# Patient Record
Sex: Female | Born: 1990 | Race: Black or African American | Hispanic: No | Marital: Single | Smoking: Current every day smoker
Health system: Southern US, Community
[De-identification: ages and names within clinical notes are randomized; demographics above are authoritative.]

---

## 2018-10-22 ENCOUNTER — Encounter (HOSPITAL_COMMUNITY): Payer: Self-pay | Admitting: Emergency Medicine

## 2018-10-22 ENCOUNTER — Other Ambulatory Visit: Payer: Self-pay

## 2018-10-22 ENCOUNTER — Emergency Department (HOSPITAL_COMMUNITY): Payer: Self-pay

## 2018-10-22 ENCOUNTER — Emergency Department (HOSPITAL_COMMUNITY)
Admission: EM | Admit: 2018-10-22 | Discharge: 2018-10-22 | Disposition: A | Discharge status: Home / Self Care | Payer: Self-pay | Attending: Emergency Medicine | Admitting: Emergency Medicine

## 2018-10-22 DIAGNOSIS — F17210 Nicotine dependence, cigarettes, uncomplicated: Secondary | ICD-10-CM | POA: Insufficient documentation

## 2018-10-22 DIAGNOSIS — R102 Pelvic and perineal pain: Secondary | ICD-10-CM | POA: Insufficient documentation

## 2018-10-22 LAB — URINALYSIS, ROUTINE W REFLEX MICROSCOPIC
Bilirubin Urine: NEGATIVE
Glucose, UA: NEGATIVE mg/dL
Hgb urine dipstick: NEGATIVE
Ketones, ur: NEGATIVE mg/dL
Leukocytes,Ua: NEGATIVE
Nitrite: NEGATIVE
Protein, ur: NEGATIVE mg/dL
Specific Gravity, Urine: 1.017 (ref 1.005–1.030)
pH: 5 (ref 5.0–8.0)

## 2018-10-22 LAB — COMPREHENSIVE METABOLIC PANEL
ALT: 13 U/L (ref 0–44)
AST: 14 U/L — ABNORMAL LOW (ref 15–41)
Albumin: 4.1 g/dL (ref 3.5–5.0)
Alkaline Phosphatase: 66 U/L (ref 38–126)
Anion gap: 8 (ref 5–15)
BUN: 9 mg/dL (ref 6–20)
CO2: 21 mmol/L — ABNORMAL LOW (ref 22–32)
Calcium: 9 mg/dL (ref 8.9–10.3)
Chloride: 108 mmol/L (ref 98–111)
Creatinine, Ser: 0.68 mg/dL (ref 0.44–1.00)
GFR calc Af Amer: >60 mL/min (ref >60)
GFR calc non Af Amer: >60 mL/min (ref >60)
Glucose, Bld: 103 mg/dL — ABNORMAL HIGH (ref 70–99)
Potassium: 3.6 mmol/L (ref 3.5–5.1)
Sodium: 137 mmol/L (ref 135–145)
Total Bilirubin: 0.2 mg/dL — ABNORMAL LOW (ref 0.3–1.2)
Total Protein: 7.6 g/dL (ref 6.5–8.1)

## 2018-10-22 LAB — CBC
HCT: 37.2 % (ref 36.0–46.0)
Hemoglobin: 11.9 g/dL — ABNORMAL LOW (ref 12.0–15.0)
MCH: 28.3 pg (ref 26.0–34.0)
MCHC: 32 g/dL (ref 30.0–36.0)
MCV: 88.4 fL (ref 80.0–100.0)
Platelets: 236 10*3/uL (ref 150–400)
RBC: 4.21 MIL/uL (ref 3.87–5.11)
RDW: 14.4 % (ref 11.5–15.5)
WBC: 12.2 10*3/uL — ABNORMAL HIGH (ref 4.0–10.5)
nRBC: 0 % (ref 0.0–0.2)

## 2018-10-22 LAB — LIPASE, BLOOD: Lipase: 26 U/L (ref 11–51)

## 2018-10-22 LAB — HIV ANTIBODY (ROUTINE TESTING W REFLEX): HIV Screen 4th Generation wRfx: NONREACTIVE

## 2018-10-22 LAB — WET PREP, GENITAL
Sperm: NONE SEEN
Trich, Wet Prep: NONE SEEN
Yeast Wet Prep HPF POC: NONE SEEN

## 2018-10-22 LAB — RPR: RPR Ser Ql: NONREACTIVE

## 2018-10-22 LAB — I-STAT BETA HCG BLOOD, ED (MC, WL, AP ONLY): I-stat hCG, quantitative: <5 m[IU]/mL (ref <5)

## 2018-10-22 MED ORDER — SODIUM CHLORIDE 0.9% FLUSH: 3.0000 mL | Freq: Once | INTRAVENOUS | Status: DC

## 2018-10-22 MED ORDER — KETOROLAC TROMETHAMINE 30 MG/ML IJ SOLN
30.0000 mg | Freq: Once | INTRAMUSCULAR | Status: AC
  Administered 2018-10-22: 30 mg via INTRAVENOUS
  Filled 2018-10-22: qty 1

## 2018-10-22 NOTE — ED Notes (Signed)
Assisted with vaginal [place]. Pt tolerated well

## 2018-10-22 NOTE — ED Triage Notes (Signed)
Patient is complaining of abdominal pain after having intercourse with her boyfriend.

## 2018-10-22 NOTE — Discharge Instructions (Addendum)
Take naproxen or ibuprofen as needed for pain.   You may take acetaminophen as needed for additional pain relief.  Return if pain is getting worse.

## 2018-10-22 NOTE — ED Provider Notes (Signed)
[facility]-EMERGENCY DEPT Provider Note   CSN: [id number] Arrival date & time: 10/22/18  0417    History   Chief Complaint Chief Complaint  Patient presents with  . Abdominal Pain    HPI Mackenzie Adams is a 28 y.o. female.   The history is provided by the patient.  She states that she was having sexual relations with her boyfriend at about noon yesterday and, after an orgasm, suddenly developed severe pain across her lower abdomen.  Pain was rated at 10/10.  There was some momentary nausea and diaphoresis but that subsided quickly.  Pain has been constant since then, but has lessened in intensity.  It is now rated at 5/10.  It is worse with palpation and movement, better with laying still.  She has not taken anything for pain.  Last menses was early April, and she states that she would be due for her menses in the next week.  She has never had anything like this before.  She is not using any contraception.  History reviewed. No pertinent past medical history.  There are no active problems to display for this patient.   History reviewed. No pertinent surgical history.   OB History   No obstetric history on file.      Home Medications    Prior to Admission medications   Not on File    Family History History reviewed. No pertinent family history.  Social History Social History   Tobacco Use  . Smoking status: Current Every Day Smoker    Types: Cigarettes  . Smokeless tobacco: Never Used  Substance Use Topics  . Alcohol use: Not Currently  . Drug use: Not Currently     Allergies   Patient has no known allergies.   Review of Systems Review of Systems  All other systems reviewed and are negative.    Physical Exam Updated Vital Signs BP 125/73 (BP Location: Left Arm)   Pulse 72   Temp 98.3 F (36.8 C) (Oral)   Resp 16   Ht 6' (1.829 m)   Wt 111.1 kg   SpO2 100%   BMI 33.23 kg/m   Physical Exam Vitals signs and nursing  note reviewed.    28 year old female, resting comfortably and in no acute distress. Vital signs are normal. Oxygen saturation is 100%, which is normal. Head is normocephalic and atraumatic. PERRLA, EOMI. Oropharynx is clear. Neck is nontender and supple without adenopathy or JVD. Back is nontender and there is no CVA tenderness. Lungs are clear without rales, wheezes, or rhonchi. Chest is nontender. Heart has regular rate and rhythm without murmur. Abdomen is soft, flat, with moderate tenderness throughout the mid and lower abdomen.  There is no rebound or guarding.  There are no masses or hepatosplenomegaly and peristalsis is normoactive. Pelvic: Normal external female genitalia.  Cervix is closed.  No vaginal bleeding.  No significant discharge present.  There is bilateral adnexal tenderness which is greater on the left than the right.  There is mild cervical motion tenderness. Extremities have no cyanosis or edema, full range of motion is present. Skin is warm and dry without rash. Neurologic: Mental status is normal, cranial nerves are intact, there are no motor or sensory deficits.  ED Treatments / Results  Labs (all labs ordered are listed, but only abnormal results are displayed) Labs Reviewed  WET PREP, GENITAL - Abnormal; Notable for the following components:      Result Value   Clue Cells Wet  Prep HPF POC PRESENT (*)    WBC, Wet Prep HPF POC FEW (*)    All other components within normal limits  COMPREHENSIVE METABOLIC PANEL - Abnormal; Notable for the following components:   CO2 21 (*)    Glucose, Bld 103 (*)    AST 14 (*)    Total Bilirubin 0.2 (*)    All other components within normal limits  CBC - Abnormal; Notable for the following components:   WBC 12.2 (*)    Hemoglobin 11.9 (*)    All other components within normal limits  URINALYSIS, ROUTINE W REFLEX MICROSCOPIC - Abnormal; Notable for the following components:   APPearance HAZY (*)    All other components  within normal limits  LIPASE, BLOOD  RPR  HIV ANTIBODY (ROUTINE TESTING W REFLEX)  I-STAT BETA HCG BLOOD, ED ([facility], [facility], [facility] ONLY)  GC/CHLAMYDIA PROBE AMP ([place]) NOT AT [facility]    Radiology US Pelvic Complete W Transvaginal And Torsion R/o  Result Date: 10/22/2018 CLINICAL DATA:  28 year old female with history of pelvic pain. EXAM: TRANSABDOMINAL AND TRANSVAGINAL ULTRASOUND OF PELVIS DOPPLER ULTRASOUND OF OVARIES TECHNIQUE: Both transabdominal and transvaginal ultrasound examinations of the pelvis were performed. Transabdominal technique was performed for global imaging of the pelvis including uterus, ovaries, adnexal regions, and pelvic cul-de-sac. It was necessary to proceed with endovaginal exam following the transabdominal exam to visualize the adnexa. Color and duplex Doppler ultrasound was utilized to evaluate blood flow to the ovaries. COMPARISON:  No priors. FINDINGS: Uterus Measurements: 5.7 x 3.9 x 4.3 cm = volume: 50.2 mL. No fibroids or other mass visualized. Endometrium Thickness: 11.4.  No focal abnormality visualized. Right ovary Measurements: 3.4 x 2.3 x 3.2 cm = volume: 13.2 mL. Probable small degenerating corpus luteum cyst. No adnexal mass. Left ovary Measurements: 2.7 x 2.1 x 2.8 cm = volume: 8.3 mL. Normal appearance/no adnexal mass. Pulsed Doppler evaluation of both ovaries demonstrates normal low-resistance arterial and venous waveforms. Other findings Small volume of free fluid in the cul-de-sac. IMPRESSION: 1. Small volume of free fluid in the cul-de-sac, presumably physiologic in this young female patient. 2. No acute findings to account for the patient's symptoms. Electronically Signed   By: Trudie Reed M.D.   On: 10/22/2018 05:50    Procedures Procedures   Medications Ordered in ED Medications  sodium chloride flush (NS) 0.9 % injection 3 mL (3 mLs Intravenous Not Given 10/22/18 0442)  ketorolac (TORADOL) 30 MG/ML injection 30 mg (30 mg Intravenous Given 10/22/18  0522)     Initial Impression / Assessment and Plan / ED Course  I have reviewed the triage vital signs and the nursing notes.  Pertinent labs & imaging results that were available during my care of the patient were reviewed by me and considered in my medical decision making (see chart for details).  Sudden onset of pelvic pain most consistent with either ruptured cyst or ovarian torsion.  Pelvic ultrasound has been ordered.  She is given a dose of ketorolac for pain.  There are no prior records in the [facility] health system.  Labs are reassuring.  Wet prep does show clue cells, but she does not clinically have bacterial vaginosis so no antibiotics are prescribed.  Ultrasound was unremarkable.  Patient does relate significant pain during ultrasound, especially in the left adnexa.  However, as at this point, no evidence of serious pathology or injury.  She had good pain relief with ketorolac and is advised to continue using over-the-counter NSAIDs.  She is referred to gynecology for follow-up.  Return precautions discussed.  Final Clinical Impressions(s) / ED Diagnoses   Final diagnoses:  Pelvic pain    ED Discharge Orders    None       Haniah Penny, DavDione Booze/23/20 [phone]

## 2018-10-25 LAB — GC/CHLAMYDIA PROBE AMP (~~LOC~~) NOT AT ARMC
Chlamydia: NEGATIVE
Neisseria Gonorrhea: NEGATIVE

## 2020-12-16 ENCOUNTER — Emergency Department (HOSPITAL_COMMUNITY)
Admission: EM | Admit: 2020-12-16 | Discharge: 2020-12-16 | Disposition: A | Discharge status: Home / Self Care | Payer: Self-pay | Attending: Emergency Medicine | Admitting: Emergency Medicine

## 2020-12-16 ENCOUNTER — Other Ambulatory Visit: Payer: Self-pay

## 2020-12-16 ENCOUNTER — Emergency Department (HOSPITAL_COMMUNITY): Payer: Self-pay

## 2020-12-16 DIAGNOSIS — R112 Nausea with vomiting, unspecified: Secondary | ICD-10-CM | POA: Insufficient documentation

## 2020-12-16 DIAGNOSIS — R1031 Right lower quadrant pain: Secondary | ICD-10-CM | POA: Insufficient documentation

## 2020-12-16 DIAGNOSIS — R1011 Right upper quadrant pain: Secondary | ICD-10-CM | POA: Insufficient documentation

## 2020-12-16 DIAGNOSIS — F17210 Nicotine dependence, cigarettes, uncomplicated: Secondary | ICD-10-CM | POA: Insufficient documentation

## 2020-12-16 DIAGNOSIS — R1084 Generalized abdominal pain: Secondary | ICD-10-CM | POA: Insufficient documentation

## 2020-12-16 DIAGNOSIS — N9489 Other specified conditions associated with female genital organs and menstrual cycle: Secondary | ICD-10-CM | POA: Insufficient documentation

## 2020-12-16 LAB — CBC WITH DIFFERENTIAL/PLATELET
Abs Immature Granulocytes: 0.01 10*3/uL (ref 0.00–0.07)
Basophils Absolute: 0 10*3/uL (ref 0.0–0.1)
Basophils Relative: 0 %
Eosinophils Absolute: 0.3 10*3/uL (ref 0.0–0.5)
Eosinophils Relative: 3 %
HCT: 39.3 % (ref 36.0–46.0)
Hemoglobin: 12.5 g/dL (ref 12.0–15.0)
Immature Granulocytes: 0 %
Lymphocytes Relative: 41 %
Lymphs Abs: 3.1 10*3/uL (ref 0.7–4.0)
MCH: 27.2 pg (ref 26.0–34.0)
MCHC: 31.8 g/dL (ref 30.0–36.0)
MCV: 85.6 fL (ref 80.0–100.0)
Monocytes Absolute: 0.7 10*3/uL (ref 0.1–1.0)
Monocytes Relative: 9 %
Neutro Abs: 3.4 10*3/uL (ref 1.7–7.7)
Neutrophils Relative %: 47 %
Platelets: 279 10*3/uL (ref 150–400)
RBC: 4.59 MIL/uL (ref 3.87–5.11)
RDW: 14.7 % (ref 11.5–15.5)
WBC: 7.5 10*3/uL (ref 4.0–10.5)
nRBC: 0 % (ref 0.0–0.2)

## 2020-12-16 LAB — I-STAT BETA HCG BLOOD, ED (MC, WL, AP ONLY): I-stat hCG, quantitative: <5 m[IU]/mL (ref <5)

## 2020-12-16 LAB — COMPREHENSIVE METABOLIC PANEL
ALT: 22 U/L (ref 0–44)
AST: 24 U/L (ref 15–41)
Albumin: 3.9 g/dL (ref 3.5–5.0)
Alkaline Phosphatase: 66 U/L (ref 38–126)
Anion gap: 10 (ref 5–15)
BUN: 7 mg/dL (ref 6–20)
CO2: 24 mmol/L (ref 22–32)
Calcium: 9.4 mg/dL (ref 8.9–10.3)
Chloride: 103 mmol/L (ref 98–111)
Creatinine, Ser: 0.66 mg/dL (ref 0.44–1.00)
GFR, Estimated: >60 mL/min (ref >60)
Glucose, Bld: 115 mg/dL — ABNORMAL HIGH (ref 70–99)
Potassium: 3.9 mmol/L (ref 3.5–5.1)
Sodium: 137 mmol/L (ref 135–145)
Total Bilirubin: 0.5 mg/dL (ref 0.3–1.2)
Total Protein: 7.2 g/dL (ref 6.5–8.1)

## 2020-12-16 LAB — LIPASE, BLOOD: Lipase: 23 U/L (ref 11–51)

## 2020-12-16 MED ORDER — ALUM & MAG HYDROXIDE-SIMETH 200-200-20 MG/5ML PO SUSP
30.0000 mL | Freq: Once | ORAL | Status: AC
  Administered 2020-12-16: 30 mL via ORAL
  Filled 2020-12-16: qty 30

## 2020-12-16 MED ORDER — OXYCODONE HCL 5 MG PO TABS
5.0000 mg | ORAL_TABLET | Freq: Once | ORAL | Status: AC
  Administered 2020-12-16: 5 mg via ORAL
  Filled 2020-12-16: qty 1

## 2020-12-16 MED ORDER — DICYCLOMINE HCL 10 MG PO CAPS
10.0000 mg | ORAL_CAPSULE | Freq: Once | ORAL | Status: AC
  Administered 2020-12-16: 10 mg via ORAL
  Filled 2020-12-16: qty 1

## 2020-12-16 MED ORDER — ACETAMINOPHEN 500 MG PO TABS
1000.0000 mg | ORAL_TABLET | Freq: Once | ORAL | Status: AC
  Administered 2020-12-16: 1000 mg via ORAL
  Filled 2020-12-16: qty 2

## 2020-12-16 NOTE — Discharge Instructions (Addendum)
Try pepcid or tagamet up to twice a day.  Try to avoid things that may make this worse, most commonly these are spicy foods tomato based products fatty foods chocolate and peppermint.  Alcohol and tobacco can also make this worse.  Return to the emergency department for sudden worsening pain fever or inability to eat or drink.  There was also more stool on your CT than normal.  Try miralax.  Take 8 scoops of miralax in 32oz of whatever you would like to drink.(Gatorade comes in this size) You can also use a fleets enema which you can buy over the counter at the pharmacy.  Return for worsening abdominal pain, vomiting or fever.

## 2020-12-16 NOTE — ED Triage Notes (Signed)
C/O right sided abdominal pain along with back pain. Stated unknown pregnancy status. Patient verbalized concern for "abdominal pregnancy" and ask for "MRI" patient also verbalized pregnancy test shows negative for her, urine or blood.

## 2020-12-16 NOTE — ED Provider Notes (Signed)
Emergency Medicine Provider Triage Evaluation Note  Mackenzie Adams , a 30 y.o. female  was evaluated in triage.  Pt complains of gradual onset, constant, achy, right sided abdominal pain for the past 6 months with nausea. Pt states that she feels like she is pregnant. She states she sees a "fish like movement" in her abdomen from time to time. She states she was seen at an ED in [place] in February and had a CT Scan which was negative. She went back about 2 weeks ago and had an ultrasound. She states the ultrasound tech told her she could see "movement in her belly" however could not see it on the ultrasound and recommended that pt have an MRI. Pt is here requesting further imaging because she wants answers. She states her LNMP was July 5th of this year and she was last sexually active in October of last year. 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   Review of Systems  Positive: + abdominal pain, nausea Negative: - fevers, chills, vomiting, diarrhea  Physical Exam  BP 111/78 (BP Location: Right Arm)   Pulse (!) 107   Temp 98.6 F (37 C) (Oral)   Resp 15   Ht 5\' 11"  (1.803 m)   Wt 103.1 kg   LMP 12/03/2020 (Exact Date)   SpO2 98%   BMI 31.69 kg/m  Gen:   Awake, no distress   Resp:  Normal effort  MSK:   Moves extremities without difficulty  Other:  No abdominal TTP on exam currently. Pt tearful.   Medical Decision Making  Medically screening exam initiated at 11:41 AM.  Appropriate orders placed.  [name] was informed that the remainder of the evaluation will be completed by another provider, this initial triage assessment does not replace that evaluation, and the importance of remaining in the ED until their evaluation is complete.     Alfonse Alpers, PA-C 12/16/20 1143    12/18/20, MD 12/26/20 [phone]

## 2020-12-16 NOTE — ED Provider Notes (Signed)
[facility] EMERGENCY DEPARTMENT Provider Note   CSN: [id number] Arrival date & time: 12/16/20  1106     History Chief Complaint  Patient presents with   Abdominal Pain    Mackenzie Adams is a 30 y.o. female.  30 yo F with a chief complaint of right-sided abdominal pain.  This is a chronic problem for her going on for at least 6 months if not longer.  She reports that she is gone to the ED multiple times in [place] and has had CT as well as ultrasound imaging.  She had last time apparently demanded an MRI of her abdomen which they had denied.  She is now here with 48 hours of worsening more on the right than the left side.  Denies any urinary symptoms denies constipation or diarrhea.  She tells me it is worse with eating.  Denies any fevers.  Has had some nausea and vomiting.  The history is provided by the patient.  Abdominal Pain Pain location:  RLQ and RUQ Pain quality: aching   Pain radiates to:  Does not radiate Pain severity:  Moderate Onset quality:  Gradual Duration:  50 weeks Timing:  Intermittent Progression:  Worsening Chronicity:  New Relieved by:  Nothing Worsened by:  Nothing Ineffective treatments:  None tried Associated symptoms: nausea and vomiting   Associated symptoms: no chest pain, no chills, no constipation, no diarrhea, no dysuria, no fever and no shortness of breath       No past medical history on file.  There are no problems to display for this patient.   No past surgical history on file.   OB History   No obstetric history on file.     No family history on file.  Social History   Tobacco Use   Smoking status: Every Day    Types: Cigarettes   Smokeless tobacco: Never  Vaping Use   Vaping Use: Never used  Substance Use Topics   Alcohol use: Not Currently   Drug use: Not Currently    Home Medications Prior to Admission medications   Not on File    Allergies    Patient has no known allergies.  Review of  Systems   Review of Systems  Constitutional:  Negative for chills and fever.  HENT:  Negative for congestion and rhinorrhea.   Eyes:  Negative for redness and visual disturbance.  Respiratory:  Negative for shortness of breath and wheezing.   Cardiovascular:  Negative for chest pain and palpitations.  Gastrointestinal:  Positive for abdominal pain, nausea and vomiting. Negative for constipation and diarrhea.  Genitourinary:  Negative for dysuria and urgency.  Musculoskeletal:  Negative for arthralgias and myalgias.  Skin:  Negative for pallor and wound.  Neurological:  Negative for dizziness and headaches.   Physical Exam Updated Vital Signs BP 108/68 (BP Location: Right Arm)   Pulse 79   Temp 98.4 F (36.9 C) (Oral)   Resp 14   Ht 5\' 11"  (1.803 m)   Wt 103.1 kg   LMP 12/03/2020 (Exact Date)   SpO2 100%   BMI 31.69 kg/m   Physical Exam Vitals and nursing note reviewed.  Constitutional:      General: She is not in acute distress.    Appearance: She is well-developed. She is not diaphoretic.  HENT:     Head: Normocephalic and atraumatic.  Eyes:     Pupils: Pupils are equal, round, and reactive to light.  Cardiovascular:     Rate  and Rhythm: Normal rate and regular rhythm.     Heart sounds: No murmur heard.   No friction rub. No gallop.  Pulmonary:     Effort: Pulmonary effort is normal.     Breath sounds: No wheezing or rales.  Abdominal:     General: There is no distension.     Palpations: Abdomen is soft.     Tenderness: There is abdominal tenderness in the right upper quadrant and right lower quadrant.  Musculoskeletal:        General: No tenderness.     Cervical back: Normal range of motion and neck supple.  Skin:    General: Skin is warm and dry.  Neurological:     Mental Status: She is alert and oriented to person, place, and time.  Psychiatric:        Behavior: Behavior normal.    ED Results / Procedures / Treatments   Labs (all labs ordered are  listed, but only abnormal results are displayed) Labs Reviewed  COMPREHENSIVE METABOLIC PANEL - Abnormal; Notable for the following components:      Result Value   Glucose, Bld 115 (*)    All other components within normal limits  LIPASE, BLOOD  CBC WITH DIFFERENTIAL/PLATELET  I-STAT BETA HCG BLOOD, ED ([facility], [facility], [facility] ONLY)    EKG None  Radiology CT Renal Stone Study  Result Date: 12/16/2020 CLINICAL DATA:  right sided abdominal pain along with back pain. Stated unknown pregnancy status. Patient verbalized concern for "abdominal pregnancy" and ask for "MRI" patient also verbalized pregnancy test shows negative for her, urine or blood. EXAM: CT ABDOMEN AND PELVIS WITHOUT CONTRAST TECHNIQUE: Multidetector CT imaging of the abdomen and pelvis was performed following the standard protocol without IV contrast. COMPARISON:  None. FINDINGS: Lower chest: No acute abnormality. Hepatobiliary: No focal liver abnormality. No gallstones, gallbladder wall thickening, or pericholecystic fluid. No biliary dilatation. Pancreas: No focal lesion. Normal pancreatic contour. No surrounding inflammatory changes. No main pancreatic ductal dilatation. Spleen: Normal in size without focal abnormality. Adrenals/Urinary Tract: No adrenal nodule bilaterally. No nephrolithiasis, no hydronephrosis, and no contour-deforming renal mass. No ureterolithiasis or hydroureter. The urinary bladder is unremarkable. Stomach/Bowel: Stomach is within normal limits. No evidence of bowel wall thickening or dilatation. Appendix appears normal. Vascular/Lymphatic: Couple phleboliths noted within the pelvis. No abdominal aorta or iliac aneurysm. No abdominal, pelvic, or inguinal lymphadenopathy. Reproductive: Uterus and bilateral adnexa are unremarkable. Other: No intraperitoneal free fluid. No intraperitoneal free gas. No organized fluid collection. Musculoskeletal: No abdominal wall hernia or abnormality. No suspicious lytic or blastic osseous  lesions. No acute displaced fracture. Multilevel degenerative changes of the spine. IMPRESSION: No acute intra-abdominal or intrapelvic abnormality with limited evaluation on this noncontrast study. Electronically Signed   By: Tish Frederickson M.D.   On: 12/16/2020 17:59    Procedures Procedures   Medications Ordered in ED Medications  acetaminophen (TYLENOL) tablet 1,000 mg (1,000 mg Oral Given 12/16/20 1714)  oxyCODONE (Oxy IR/ROXICODONE) immediate release tablet 5 mg (5 mg Oral Given 12/16/20 1714)  dicyclomine (BENTYL) capsule 10 mg (10 mg Oral Given 12/16/20 1715)  alum & mag hydroxide-simeth (MAALOX/MYLANTA) 200-200-20 MG/5ML suspension 30 mL (30 mLs Oral Given 12/16/20 1714)    ED Course  I have reviewed the triage vital signs and the nursing notes.  Pertinent labs & imaging results that were available during my care of the patient were reviewed by me and considered in my medical decision making (see chart for details).    MDM  Rules/Calculators/A&P                          30 yo F with chief complaints of right-sided abdominal pain.  This been going on for at least 6 months.  She has had multiple ED visits mostly in [place] and has reportedly had ultrasound as well as CT imaging multiple times without obvious cause of her symptoms.  She feels like the pain is gotten worse over the past 48 hours is more on the right side than the left.  Feels typical to her common pain.  Usually is worse with eating and drinking and has had some nausea and vomiting.  Patient has a CT scan in our system that showed diverticulitis.  Her pain is more on the right side today, will obtain a stone study as it seems more like kidney stone pain unrelenting right-sided.  Reassess.  Blood work is unremarkable.  Patient is concerned she is pregnant her pregnancy test is negative.  CT scan negative.  D/c home.  GI follow up.  6:09 PM:  I have discussed the diagnosis/risks/treatment options with the patient and  believe the pt to be eligible for discharge home to follow-up with PCP, GI. We also discussed returning to the ED immediately if new or worsening sx occur. We discussed the sx which are most concerning (e.g., sudden worsening pain, fever, inability to tolerate by mouth) that necessitate immediate return. Medications administered to the patient during their visit and any new prescriptions provided to the patient are listed below.  Medications given during this visit Medications  acetaminophen (TYLENOL) tablet 1,000 mg (1,000 mg Oral Given 12/16/20 1714)  oxyCODONE (Oxy IR/ROXICODONE) immediate release tablet 5 mg (5 mg Oral Given 12/16/20 1714)  dicyclomine (BENTYL) capsule 10 mg (10 mg Oral Given 12/16/20 1715)  alum & mag hydroxide-simeth (MAALOX/MYLANTA) 200-200-20 MG/5ML suspension 30 mL (30 mLs Oral Given 12/16/20 1714)     The patient appears reasonably screen and/or stabilized for discharge and I doubt any other medical condition or other [facility] requiring further screening, evaluation, or treatment in the ED at this time prior to discharge.    Final Clinical Impression(s) / ED Diagnoses Final diagnoses:  Generalized abdominal pain    Rx / DC Orders ED Discharge Orders     None        Melene Plan, DO 12/16/20 1809

## 2020-12-18 ENCOUNTER — Encounter: Payer: Self-pay | Admitting: Gastroenterology

## 2021-01-27 ENCOUNTER — Ambulatory Visit: Payer: Self-pay | Admitting: Gastroenterology

## 2021-11-24 ENCOUNTER — Encounter (HOSPITAL_COMMUNITY): Payer: Self-pay

## 2021-11-24 ENCOUNTER — Emergency Department (HOSPITAL_COMMUNITY)
Admission: EM | Admit: 2021-11-24 | Discharge: 2021-11-24 | Discharge status: Against Medical Advice | Payer: Self-pay | Attending: Physician Assistant | Admitting: Physician Assistant

## 2021-11-24 ENCOUNTER — Other Ambulatory Visit: Payer: Self-pay

## 2021-11-24 DIAGNOSIS — R110 Nausea: Secondary | ICD-10-CM | POA: Insufficient documentation

## 2021-11-24 DIAGNOSIS — R519 Headache, unspecified: Secondary | ICD-10-CM | POA: Insufficient documentation

## 2021-11-24 DIAGNOSIS — R1084 Generalized abdominal pain: Secondary | ICD-10-CM | POA: Insufficient documentation

## 2021-11-24 DIAGNOSIS — M549 Dorsalgia, unspecified: Secondary | ICD-10-CM | POA: Insufficient documentation

## 2021-11-24 DIAGNOSIS — R531 Weakness: Secondary | ICD-10-CM | POA: Insufficient documentation

## 2021-11-24 DIAGNOSIS — Z5321 Procedure and treatment not carried out due to patient leaving prior to being seen by health care provider: Secondary | ICD-10-CM | POA: Insufficient documentation

## 2021-11-24 DIAGNOSIS — R5383 Other fatigue: Secondary | ICD-10-CM | POA: Insufficient documentation

## 2021-11-24 LAB — CBC WITH DIFFERENTIAL/PLATELET
Abs Immature Granulocytes: 0.03 10*3/uL (ref 0.00–0.07)
Basophils Absolute: 0.1 10*3/uL (ref 0.0–0.1)
Basophils Relative: 1 %
Eosinophils Absolute: 0.2 10*3/uL (ref 0.0–0.5)
Eosinophils Relative: 2 %
HCT: 34.1 % — ABNORMAL LOW (ref 36.0–46.0)
Hemoglobin: 10.7 g/dL — ABNORMAL LOW (ref 12.0–15.0)
Immature Granulocytes: 0 %
Lymphocytes Relative: 41 %
Lymphs Abs: 3.9 10*3/uL (ref 0.7–4.0)
MCH: 26.9 pg (ref 26.0–34.0)
MCHC: 31.4 g/dL (ref 30.0–36.0)
MCV: 85.7 fL (ref 80.0–100.0)
Monocytes Absolute: 0.4 10*3/uL (ref 0.1–1.0)
Monocytes Relative: 4 %
Neutro Abs: 5 10*3/uL (ref 1.7–7.7)
Neutrophils Relative %: 52 %
Platelets: 245 10*3/uL (ref 150–400)
RBC: 3.98 MIL/uL (ref 3.87–5.11)
RDW: 17.6 % — ABNORMAL HIGH (ref 11.5–15.5)
WBC: 9.6 10*3/uL (ref 4.0–10.5)
nRBC: 0 % (ref 0.0–0.2)

## 2021-11-24 LAB — COMPREHENSIVE METABOLIC PANEL
ALT: 14 U/L (ref 0–44)
AST: 16 U/L (ref 15–41)
Albumin: 3.5 g/dL (ref 3.5–5.0)
Alkaline Phosphatase: 54 U/L (ref 38–126)
Anion gap: 8 (ref 5–15)
BUN: 9 mg/dL (ref 6–20)
CO2: 22 mmol/L (ref 22–32)
Calcium: 8.4 mg/dL — ABNORMAL LOW (ref 8.9–10.3)
Chloride: 108 mmol/L (ref 98–111)
Creatinine, Ser: 0.89 mg/dL (ref 0.44–1.00)
GFR, Estimated: >60 mL/min (ref >60)
Glucose, Bld: 94 mg/dL (ref 70–99)
Potassium: 4.1 mmol/L (ref 3.5–5.1)
Sodium: 138 mmol/L (ref 135–145)
Total Bilirubin: 0.4 mg/dL (ref 0.3–1.2)
Total Protein: 6.3 g/dL — ABNORMAL LOW (ref 6.5–8.1)

## 2021-11-24 LAB — I-STAT BETA HCG BLOOD, ED (MC, WL, AP ONLY): I-stat hCG, quantitative: <5 m[IU]/mL (ref <5)

## 2021-11-24 LAB — LIPASE, BLOOD: Lipase: 27 U/L (ref 11–51)

## 2021-11-24 MED ORDER — ONDANSETRON 4 MG PO TBDP
4.0000 mg | ORAL_TABLET | Freq: Once | ORAL | Status: AC
  Administered 2021-11-24: 4 mg via ORAL
  Filled 2021-11-24: qty 1

## 2021-12-10 ENCOUNTER — Encounter (HOSPITAL_BASED_OUTPATIENT_CLINIC_OR_DEPARTMENT_OTHER): Payer: Self-pay

## 2021-12-10 ENCOUNTER — Other Ambulatory Visit: Payer: Self-pay

## 2021-12-10 ENCOUNTER — Emergency Department (HOSPITAL_BASED_OUTPATIENT_CLINIC_OR_DEPARTMENT_OTHER): Payer: Self-pay

## 2021-12-10 ENCOUNTER — Emergency Department (HOSPITAL_BASED_OUTPATIENT_CLINIC_OR_DEPARTMENT_OTHER)
Admission: EM | Admit: 2021-12-10 | Discharge: 2021-12-11 | Disposition: A | Discharge status: Home / Self Care | Payer: Self-pay | Attending: Emergency Medicine | Admitting: Emergency Medicine

## 2021-12-10 DIAGNOSIS — M5410 Radiculopathy, site unspecified: Secondary | ICD-10-CM

## 2021-12-10 DIAGNOSIS — M5127 Other intervertebral disc displacement, lumbosacral region: Secondary | ICD-10-CM

## 2021-12-10 DIAGNOSIS — M5416 Radiculopathy, lumbar region: Secondary | ICD-10-CM

## 2021-12-10 LAB — URINALYSIS, ROUTINE W REFLEX MICROSCOPIC
Bilirubin Urine: NEGATIVE
Glucose, UA: NEGATIVE mg/dL
Hgb urine dipstick: NEGATIVE
Ketones, ur: NEGATIVE mg/dL
Leukocytes,Ua: NEGATIVE
Nitrite: NEGATIVE
Protein, ur: NEGATIVE mg/dL
Specific Gravity, Urine: 1.007 (ref 1.005–1.030)
pH: 7 (ref 5.0–8.0)

## 2021-12-10 LAB — PREGNANCY, URINE: Preg Test, Ur: NEGATIVE

## 2021-12-10 MED ORDER — METHOCARBAMOL 500 MG PO TABS: 500.0000 mg | ORAL_TABLET | Freq: Two times a day (BID) | ORAL | 0 refills | Status: AC

## 2021-12-10 MED ORDER — NAPROXEN 500 MG PO TABS: 500.0000 mg | ORAL_TABLET | Freq: Two times a day (BID) | ORAL | 0 refills | Status: AC

## 2021-12-10 MED ORDER — DEXAMETHASONE SODIUM PHOSPHATE 10 MG/ML IJ SOLN
10.0000 mg | Freq: Once | INTRAMUSCULAR | Status: AC
  Administered 2021-12-10: 10 mg via INTRAMUSCULAR
  Filled 2021-12-10: qty 1

## 2021-12-10 MED ORDER — HYDROCODONE-ACETAMINOPHEN 5-325 MG PO TABS
1.0000 | ORAL_TABLET | Freq: Once | ORAL | Status: AC
  Administered 2021-12-10: 1 via ORAL
  Filled 2021-12-10: qty 1

## 2021-12-10 MED ORDER — DIAZEPAM 5 MG PO TABS
5.0000 mg | ORAL_TABLET | Freq: Once | ORAL | Status: AC
  Administered 2021-12-10: 5 mg via ORAL
  Filled 2021-12-10: qty 1

## 2021-12-10 NOTE — ED Provider Notes (Signed)
[facility] EMERGENCY DEPT Provider Note   CSN: [id number] Arrival date & time: 12/10/21  1611     History  Chief Complaint  Patient presents with   Back Pain    Mackenzie Adams is a 31 y.o. female.  HPI     31 year old female comes in with chief complaint of back pain.  Patient states that she has been having back pain for the last several months, however over the last 3 to 4 weeks the pain is worsened.  Pain is now radiating down her legs bilaterally.  She also has intermittent episodes of tingling.  She feels occasionally some pressure, tightness type sensation around her anogenital region.  Patient has no significant medical history.  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 Patient denies any urinary incontinence, urinary retention, bowel incontinence, pins and needle sensation around her genitalia and bilateral lower extremity weakness.  Home Medications Prior to Admission medications   Medication Sig Start Date End Date Taking? Authorizing Provider  methocarbamol (ROBAXIN) 500 MG tablet Take 1 tablet (500 mg total) by mouth 2 (two) times daily. 12/10/21  Yes Christinamarie Tall, MD  naproxen (NAPROSYN) 500 MG tablet Take 1 tablet (500 mg total) by mouth 2 (two) times daily. 12/10/21  Yes Derwood Kaplan, MD      Allergies    Patient has no known allergies.    Review of Systems   Review of Systems  All other systems reviewed and are negative.   Physical Exam Updated Vital Signs BP 115/66   Pulse (!) 53   Temp 99.1 F (37.3 C)   Resp 18   Ht 5\' 11"  (1.803 m)   Wt 108.4 kg   LMP 11/19/2021 (Exact Date)   SpO2 100%   BMI 33.33 kg/m  Physical Exam Vitals and nursing note reviewed.  Constitutional:      Appearance: She is well-developed.  HENT:     Head: Atraumatic.  Cardiovascular:     Rate and Rhythm: Normal rate.  Pulmonary:     Effort: Pulmonary effort is normal.  Musculoskeletal:     Cervical back: Normal range of  motion and neck supple.     Comments: Pt has tenderness over the lumbar region No step offs, no erythema. Pt has 1+ patellar reflex bilaterally. Able to discriminate between sharp and dull. Able to ambulate   Skin:    General: Skin is warm and dry.  Neurological:     Mental Status: She is alert and oriented to person, place, and time.     ED Results / Procedures / Treatments   Labs (all labs ordered are listed, but only abnormal results are displayed) Labs Reviewed  URINALYSIS, ROUTINE W REFLEX MICROSCOPIC - Abnormal; Notable for the following components:      Result Value   Color, Urine COLORLESS (*)    All other components within normal limits  PREGNANCY, URINE    EKG None  Radiology No results found.  Procedures Procedures    Medications Ordered in ED Medications  dexamethasone (DECADRON) injection 10 mg (10 mg Intramuscular Given 12/10/21 2302)  HYDROcodone-acetaminophen (NORCO/VICODIN) 5-325 MG per tablet 1 tablet (1 tablet Oral Given 12/10/21 2301)  diazepam (VALIUM) tablet 5 mg (5 mg Oral Given 12/10/21 2302)    ED Course/ Medical Decision Making/ A&P                           Medical Decision Making Amount and/or  Complexity of Data Reviewed Labs: ordered. Radiology: ordered.  Risk Prescription drug management.   This patient presents to the ED with chief complaint(s) of back pain with pertinent past medical history of worsening pain over the last month, with chronic pain issue for the last year and her back.  The differential diagnosis includes  - DJD of the back - Spondylitises/ spondylosis - Sciatica - Spinal cord compression - Conus medullaris - Epidural hematoma - Epidural abscess - Lytic/pathologic fracture - Myelitis - Musculoskeletal pain  Based on history and exam, patient does not have cauda equina or cord compression. I do think that herniated disc and degenerative disease with spinal stenosis are possible.  Favoring herniated disc  given pain shooting down both legs.  Patient does not need emergent MRI.  Unfortunately, patient does not have insurance, does not have PCP follow-up. Given this social determinant of health, I had discussion with the patient's on the neck step.  She was given the option of being transferred to [facility] for MRI now, getting a CT scan without contrast in the ER here, and for her to return to [facility], ER if her symptoms progresses or follow-up with [facility] for outpatient work-up, which might take several weeks.  Patient prefers at least getting a CT scan here.  She does not want to stay for MRI.  She will return to call if her symptoms get worse.  With this in mind, we will proceed with CT lumbar spine without contrast. I will give her IM dexamethasone.  Final Clinical Impression(s) / ED Diagnoses Final diagnoses:  Radicular pain  Lumbar radiculopathy    Rx / DC Orders ED Discharge Orders          Ordered    naproxen (NAPROSYN) 500 MG tablet  2 times daily        12/10/21 2336    methocarbamol (ROBAXIN) 500 MG tablet  2 times daily        12/10/21 2336              Derwood Kaplan, MD 12/10/21 2338

## 2021-12-10 NOTE — ED Triage Notes (Signed)
Patient here POV from Home.  Endorses Lower Back Pain that radiates to Lower Extremities since Last Year.  Was at ED a few weeks ago and had studies completed but states she LWBS.   NAD Noted during Triage. A&Ox4. GCS 15. Ambulatory.

## 2021-12-10 NOTE — Discharge Instructions (Addendum)
We signed the ER for back pain.  Please take the medications prescribed.  Please read the instructions provided, specifically instructions going over the rehab exercises and stretching for back.  Call the phone number provided to set up an outpatient follow-up for further work-up.  Please return to the ER immediately started having severe back pain radiating down your legs, numbness or tingling in both of your legs, weakness in both of your legs, urinary accidents were you wet yourself without knowing or retaining a urine, or if you start having pins and needle sensation around your genitalia.

## 2021-12-10 NOTE — ED Notes (Signed)
Back pain that radiates down both LE over a year.   Denies any injury

## 2021-12-11 NOTE — ED Provider Notes (Signed)
Nursing notes and vitals signs, including pulse oximetry, reviewed.  Summary of this visit's results, reviewed by myself:  EKG:  EKG Interpretation  Date/Time:    Ventricular Rate:    PR Interval:    QRS Duration:   QT Interval:    QTC Calculation:   R Axis:     Text Interpretation:          Labs:  Results for orders placed or performed during the hospital encounter of 12/10/21 (from the past 24 hour(s))  Urinalysis, Routine w reflex microscopic Urine, Clean Catch     Status: Abnormal   Collection Time: 12/10/21  5:00 PM  Result Value Ref Range   Color, Urine COLORLESS (A) YELLOW   APPearance CLEAR CLEAR   Specific Gravity, Urine 1.007 1.005 - 1.030   pH 7.0 5.0 - 8.0   Glucose, UA NEGATIVE NEGATIVE mg/dL   Hgb urine dipstick NEGATIVE NEGATIVE   Bilirubin Urine NEGATIVE NEGATIVE   Ketones, ur NEGATIVE NEGATIVE mg/dL   Protein, ur NEGATIVE NEGATIVE mg/dL   Nitrite NEGATIVE NEGATIVE   Leukocytes,Ua NEGATIVE NEGATIVE  Pregnancy, urine     Status: None   Collection Time: 12/10/21  5:00 PM  Result Value Ref Range   Preg Test, Ur NEGATIVE NEGATIVE    Imaging Studies: CT Lumbar Spine Wo Contrast  Result Date: 12/11/2021 CLINICAL DATA:  Initial evaluation for acute back trauma. EXAM: CT LUMBAR SPINE WITHOUT CONTRAST TECHNIQUE: Multidetector CT imaging of the lumbar spine was performed without intravenous contrast administration. Multiplanar CT image reconstructions were also generated. RADIATION DOSE REDUCTION: This exam was performed according to the departmental dose-optimization program which includes automated exposure control, adjustment of the mA and/or kV according to patient size and/or use of iterative reconstruction technique. COMPARISON:  None Available. FINDINGS: Segmentation: Standard. Lowest well-formed disc space labeled the L5-S1 level. Alignment: Physiologic with preservation of the normal lumbar lordosis. No listhesis. Vertebrae: Vertebral body height maintained  without acute or chronic fracture. Visualized sacrum and pelvis intact. SI joints symmetric and normal. No discrete or worrisome osseous lesions. Paraspinal and other soft tissues: Paraspinous soft tissues demonstrate no acute finding. Disc levels: No significant findings are seen through the L4-5 level. L5-S1: Broad-based central disc protrusion closely approximates and/or contacts both of the descending S1 nerve roots. Minimal facet spurring. No significant spinal stenosis. Foramina remain patent. IMPRESSION: 1. No acute traumatic injury within the lumbar spine. 2. Broad-based central disc protrusion at L5-S1, closely approximating and potentially affecting either of the descending S1 nerve roots. Electronically Signed   By: Rise Mu M.D.   On: 12/11/2021 00:25       Raynell Upton, Jonny Ruiz, MD 12/11/21 0030

## 2021-12-12 ENCOUNTER — Emergency Department (HOSPITAL_COMMUNITY)
Admission: EM | Admit: 2021-12-12 | Discharge: 2021-12-12 | Disposition: A | Discharge status: Home / Self Care | Payer: Self-pay | Attending: Emergency Medicine | Admitting: Emergency Medicine

## 2021-12-12 ENCOUNTER — Encounter (HOSPITAL_COMMUNITY): Payer: Self-pay | Admitting: Emergency Medicine

## 2021-12-12 ENCOUNTER — Other Ambulatory Visit: Payer: Self-pay

## 2021-12-12 DIAGNOSIS — M5440 Lumbago with sciatica, unspecified side: Secondary | ICD-10-CM | POA: Insufficient documentation

## 2021-12-12 LAB — CBC WITH DIFFERENTIAL/PLATELET
Abs Immature Granulocytes: 0.05 10*3/uL (ref 0.00–0.07)
Basophils Absolute: 0.1 10*3/uL (ref 0.0–0.1)
Basophils Relative: 0 %
Eosinophils Absolute: 0.1 10*3/uL (ref 0.0–0.5)
Eosinophils Relative: 1 %
HCT: 34.5 % — ABNORMAL LOW (ref 36.0–46.0)
Hemoglobin: 10.8 g/dL — ABNORMAL LOW (ref 12.0–15.0)
Immature Granulocytes: 0 %
Lymphocytes Relative: 42 %
Lymphs Abs: 5.8 10*3/uL — ABNORMAL HIGH (ref 0.7–4.0)
MCH: 26.3 pg (ref 26.0–34.0)
MCHC: 31.3 g/dL (ref 30.0–36.0)
MCV: 83.9 fL (ref 80.0–100.0)
Monocytes Absolute: 0.7 10*3/uL (ref 0.1–1.0)
Monocytes Relative: 5 %
Neutro Abs: 7.1 10*3/uL (ref 1.7–7.7)
Neutrophils Relative %: 52 %
Platelets: 240 10*3/uL (ref 150–400)
RBC: 4.11 MIL/uL (ref 3.87–5.11)
RDW: 18.5 % — ABNORMAL HIGH (ref 11.5–15.5)
WBC: 13.8 10*3/uL — ABNORMAL HIGH (ref 4.0–10.5)
nRBC: 0 % (ref 0.0–0.2)

## 2021-12-12 LAB — COMPREHENSIVE METABOLIC PANEL
ALT: 15 U/L (ref 0–44)
AST: 14 U/L — ABNORMAL LOW (ref 15–41)
Albumin: 3.8 g/dL (ref 3.5–5.0)
Alkaline Phosphatase: 75 U/L (ref 38–126)
Anion gap: 9 (ref 5–15)
BUN: 14 mg/dL (ref 6–20)
CO2: 20 mmol/L — ABNORMAL LOW (ref 22–32)
Calcium: 8.9 mg/dL (ref 8.9–10.3)
Chloride: 111 mmol/L (ref 98–111)
Creatinine, Ser: 0.81 mg/dL (ref 0.44–1.00)
GFR, Estimated: >60 mL/min (ref >60)
Glucose, Bld: 92 mg/dL (ref 70–99)
Potassium: 4.1 mmol/L (ref 3.5–5.1)
Sodium: 140 mmol/L (ref 135–145)
Total Bilirubin: 0.2 mg/dL — ABNORMAL LOW (ref 0.3–1.2)
Total Protein: 6.8 g/dL (ref 6.5–8.1)

## 2021-12-12 LAB — LIPASE, BLOOD: Lipase: 27 U/L (ref 11–51)

## 2021-12-12 MED ORDER — PREDNISONE 10 MG PO TABS: 40.0000 mg | ORAL_TABLET | Freq: Every day | ORAL | 0 refills | Status: AC

## 2021-12-12 MED ORDER — PREDNISONE 20 MG PO TABS
40.0000 mg | ORAL_TABLET | Freq: Once | ORAL | Status: AC
  Administered 2021-12-12: 40 mg via ORAL
  Filled 2021-12-12: qty 2

## 2021-12-12 NOTE — ED Triage Notes (Signed)
Patient reports persistent pain across her abdomen/feels bloated for several weeks with nausea , no emesis or fever .

## 2021-12-12 NOTE — ED Notes (Signed)
Discharge instructions reviewed with patient. Patient verbalized understanding of instructions. Follow-up care and medications were reviewed. Patient ambulatory with steady gait. VSS upon discharge.  ?

## 2021-12-12 NOTE — Discharge Instructions (Addendum)
Return for any problem.  ?

## 2021-12-12 NOTE — ED Provider Triage Note (Signed)
Emergency Medicine Provider Triage Evaluation Note  Mackenzie Adams , a 31 y.o. female  was evaluated in triage.  Pt complains of abdominal pain and back pain for a few weeks.  She reports bloating.  She states her sciatic nerves feel like they are pinching. She reports that she is having pain.  This has been waxing and waning per years.  She was seen on the 12th and had a CT lumbar for this. She reports no change with the medication.   Physical Exam  BP 108/65 (BP Location: Left Arm)   Pulse 91   Temp 98.7 F (37.1 C) (Oral)   Resp 17   LMP 11/19/2021 (Exact Date)   SpO2 98%  Gen:   Awake, no distress   Resp:  Normal effort  MSK:   Moves extremities without difficulty  Other:  Normal speech  Medical Decision Making  Medically screening exam initiated at 9:07 PM.  Appropriate orders placed.  Titilayo Hagans was informed that the remainder of the evaluation will be completed by another provider, this initial triage assessment does not replace that evaluation, and the importance of remaining in the ED until their evaluation is complete.  She denies any changes to bowel or bladder function.  No saddle anesthesia or paresthesia. Patient doesn't have any neurologic deficits, no evidence of cauda equina.   Patient became upset that I would not order a MRI from triage.  On chart review she did have urines obtained 2 days ago without evidence of infection and negative for pregnancy.  I offered her CT scans of her abdomen pelvis given that her pain goes into her abdomen and pelvis which she declined stating that she needs a MRI because there is "something deeper going on." We will check CBC and CMP, along with lipase due to patient's pain.    Cristina Gong, [place] 12/12/21 2120

## 2021-12-12 NOTE — ED Provider Notes (Signed)
[place] EMERGENCY DEPARTMENT Provider Note   CSN: [id number] Arrival date & time: 12/12/21  1957     History  Chief Complaint  Patient presents with   Abdominal Pain    Mackenzie Adams is a 31 y.o. female.  31 year old female with prior medical history as detailed below presents for evaluation.  Patient complains of persistent low back pain for the last year.  Patient reports that pain radiates from her low back to her legs.  Patient reports pain radiates to her perineum and rectum.  Patient was seen for same complaint 2 days ago at [facility] ED.  Patient reports that symptoms persist.  She denies worsening of symptoms.  She denies fever.  She denies difficulty urinating or difficulty her bowel moods.  She reports increased pain with lifting.  The history is provided by the patient and medical records.       Home Medications Prior to Admission medications   Medication Sig Start Date End Date Taking? Authorizing Provider  diphenhydramine-acetaminophen (TYLENOL PM) 25-500 MG TABS tablet Take 1 tablet by mouth at bedtime.   Yes [provider]  ibuprofen (ADVIL) 200 MG tablet Take 200 mg by mouth every 6 (six) hours as needed for mild pain.   Yes [provider]  methocarbamol (ROBAXIN) 500 MG tablet Take 1 tablet (500 mg total) by mouth 2 (two) times daily. Patient not taking: Reported on 12/12/2021 12/10/21   Varney Biles, MD  naproxen (NAPROSYN) 500 MG tablet Take 1 tablet (500 mg total) by mouth 2 (two) times daily. Patient not taking: Reported on 12/12/2021 12/10/21   Varney Biles, MD      Allergies    Patient has no known allergies.    Review of Systems   Review of Systems  All other systems reviewed and are negative.   Physical Exam Updated Vital Signs BP 108/65 (BP Location: Left Arm)   Pulse 91   Temp 98.7 F (37.1 C) (Oral)   Resp 17   LMP 11/19/2021 (Exact Date)   SpO2 98%  Physical Exam Vitals and nursing  note reviewed.  Constitutional:      General: She is not in acute distress.    Appearance: Normal appearance. She is well-developed.  HENT:     Head: Normocephalic and atraumatic.  Eyes:     Conjunctiva/sclera: Conjunctivae normal.     Pupils: Pupils are equal, round, and reactive to light.  Cardiovascular:     Rate and Rhythm: Normal rate and regular rhythm.     Heart sounds: Normal heart sounds.  Pulmonary:     Effort: Pulmonary effort is normal. No respiratory distress.     Breath sounds: Normal breath sounds.  Abdominal:     General: There is no distension.     Palpations: Abdomen is soft.     Tenderness: There is no abdominal tenderness.  Musculoskeletal:        General: No deformity. Normal range of motion.     Cervical back: Normal range of motion and neck supple.  Skin:    General: Skin is warm and dry.  Neurological:     General: No focal deficit present.     Mental Status: She is alert and oriented to person, place, and time.     Cranial Nerves: No cranial nerve deficit.     Motor: No weakness.     Comments: Normal speech, no facial droop, 5 out of 5 strength in both upper and both lower extremities.  Normal gait.  ED Results / Procedures / Treatments   Labs (all labs ordered are listed, but only abnormal results are displayed) Labs Reviewed  CBC WITH DIFFERENTIAL/PLATELET - Abnormal; Notable for the following components:      Result Value   WBC 13.8 (*)    Hemoglobin 10.8 (*)    HCT 34.5 (*)    RDW 18.5 (*)    Lymphs Abs 5.8 (*)    All other components within normal limits  COMPREHENSIVE METABOLIC PANEL - Abnormal; Notable for the following components:   CO2 20 (*)    AST 14 (*)    Total Bilirubin 0.2 (*)    All other components within normal limits  LIPASE, BLOOD    EKG None  Radiology CT Lumbar Spine Wo Contrast  Result Date: 12/11/2021 CLINICAL DATA:  Initial evaluation for acute back trauma. EXAM: CT LUMBAR SPINE WITHOUT CONTRAST  TECHNIQUE: Multidetector CT imaging of the lumbar spine was performed without intravenous contrast administration. Multiplanar CT image reconstructions were also generated. RADIATION DOSE REDUCTION: This exam was performed according to the departmental dose-optimization program which includes automated exposure control, adjustment of the mA and/or kV according to patient size and/or use of iterative reconstruction technique. COMPARISON:  None Available. FINDINGS: Segmentation: Standard. Lowest well-formed disc space labeled the L5-S1 level. Alignment: Physiologic with preservation of the normal lumbar lordosis. No listhesis. Vertebrae: Vertebral body height maintained without acute or chronic fracture. Visualized sacrum and pelvis intact. SI joints symmetric and normal. No discrete or worrisome osseous lesions. Paraspinal and other soft tissues: Paraspinous soft tissues demonstrate no acute finding. Disc levels: No significant findings are seen through the L4-5 level. L5-S1: Broad-based central disc protrusion closely approximates and/or contacts both of the descending S1 nerve roots. Minimal facet spurring. No significant spinal stenosis. Foramina remain patent. IMPRESSION: 1. No acute traumatic injury within the lumbar spine. 2. Broad-based central disc protrusion at L5-S1, closely approximating and potentially affecting either of the descending S1 nerve roots. Electronically Signed   By: Rise Mu M.D.   On: 12/11/2021 00:25    Procedures Procedures    Medications Ordered in ED Medications - No data to display  ED Course/ Medical Decision Making/ A&P                           Medical Decision Making   Medical Screen Complete  This patient presented to the ED with complaint of back pain.  This complaint involves an extensive number of treatment options. The initial differential diagnosis includes, but is not limited to, DJD, sciatica, cord compression, epidural hematoma,  musculoskeletal pain, etc.  This presentation is: Chronic, Self-Limited, Previously Undiagnosed, Uncertain Prognosis, Complicated, Systemic Symptoms, and Threat to Life/Bodily Function  Patient with longstanding history of chronic back pain.  Patient's history and exam and recent work-up consistent with possible compression of S1 nerve roots.  Patient without indication for emergent MRI.  Patient would possibly benefit from lengthier steroid course.  Additionally, patient reports that she is moving to [place] in less than 1 week.  She is advised to closely follow-up with a PCP in [place] once he is established there.  Additional history obtained:  External records from outside sources obtained and reviewed including prior ED visits and prior Inpatient records.    Lab Tests:  I ordered and personally interpreted labs.  The pertinent results include: CBC, CMP, lipase  Medicines ordered:  I ordered medication including prednisone for back pain Reevaluation of the patient  after these medicines showed that the patient: improved   Problem List / ED Course:  Back pain   Reevaluation:  After the interventions noted above, I reevaluated the patient and found that they have: improved   Disposition:  After consideration of the diagnostic results and the patients response to treatment, I feel that the patent would benefit from close outpatient follow-up.          Final Clinical Impression(s) / ED Diagnoses Final diagnoses:  Low back pain with sciatica, sciatica laterality unspecified, unspecified back pain laterality, unspecified chronicity    Rx / DC Orders ED Discharge Orders          Ordered    predniSONE (DELTASONE) 10 MG tablet  Daily        12/12/21 2240              Wynetta Fines, MD 12/12/21 2246

## 2022-07-11 IMAGING — CT CT RENAL STONE PROTOCOL
2 of 3 series · 17 of 46 positions shown, 19 images · non-contrast
Comparison: None.

CLINICAL DATA: right sided abdominal pain along with back pain.
Stated unknown pregnancy status. Patient verbalized concern for
"abdominal pregnancy" and ask for "MRI" patient also verbalized
pregnancy test shows negative for her, urine or blood.

EXAM:
CT ABDOMEN AND PELVIS WITHOUT CONTRAST
TECHNIQUE: Multidetector CT imaging of the abdomen and pelvis was performed
following the standard protocol without IV contrast.

[Series 3: stone study 5.0 i30f 2 · axial · 0.72mm/px · z∈[-376,+14]mm · 14 of 90 slices shown, 16 images]
[im 6/90  soft-tissue]
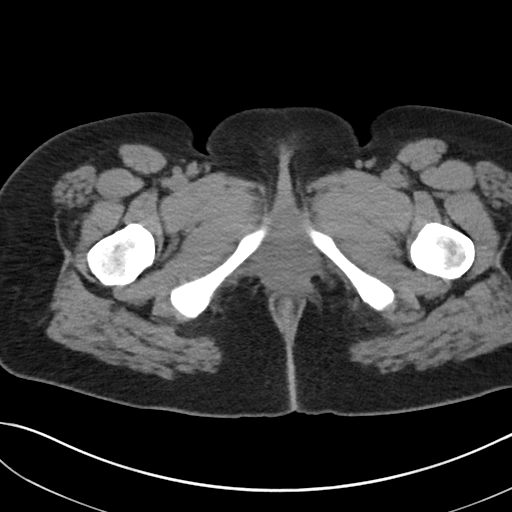
[im 6/90  bone]
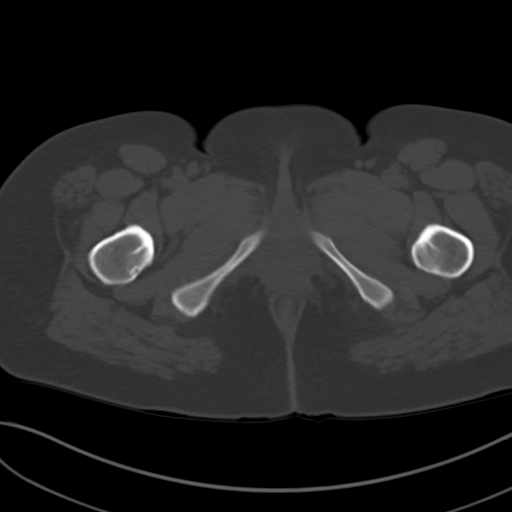
[im 12/90  soft-tissue]
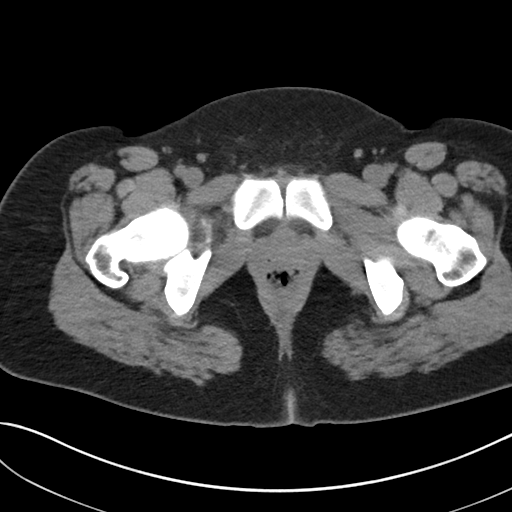
[im 18/90  soft-tissue]
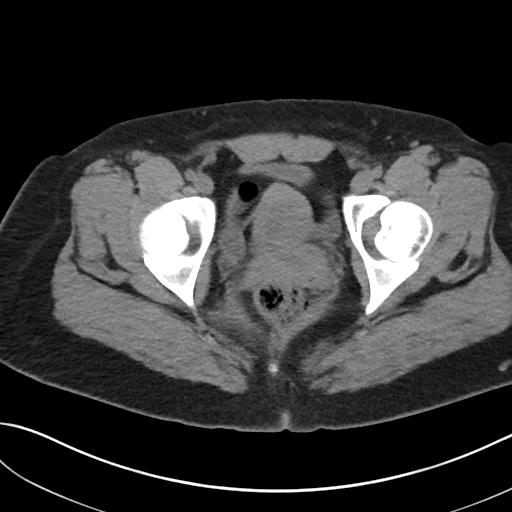
[im 23/90  soft-tissue]
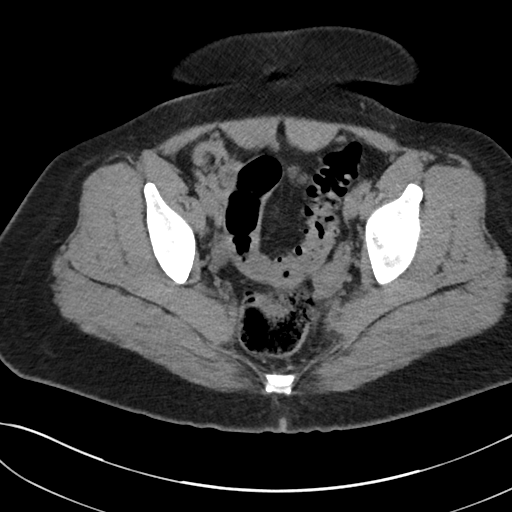
[im 29/90  soft-tissue]
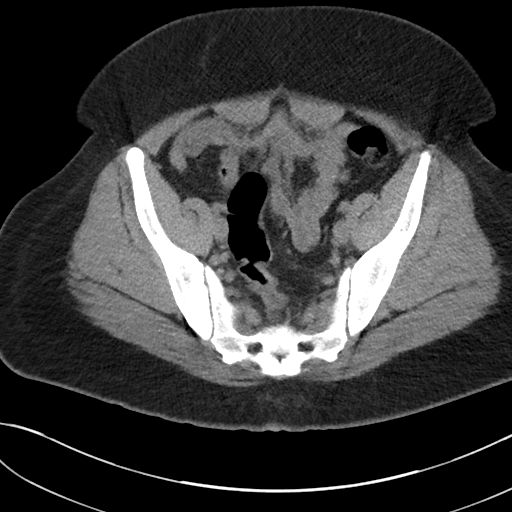
[im 35/90  soft-tissue]
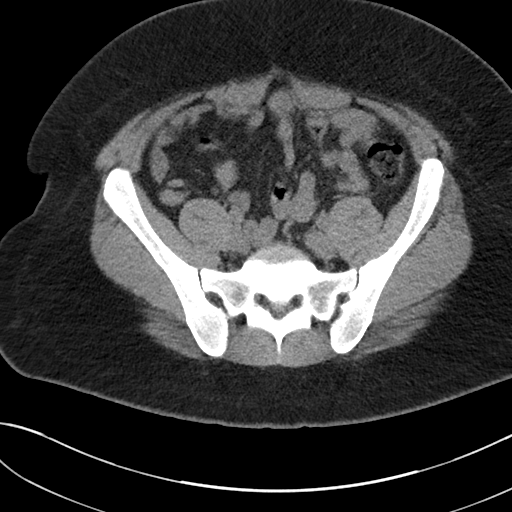
[im 41/90  soft-tissue]
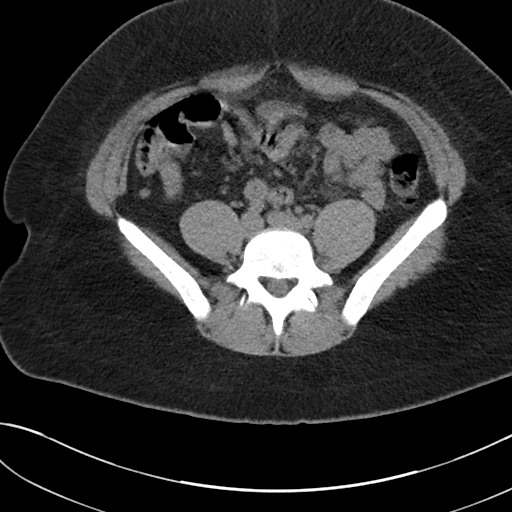
[im 49/90  soft-tissue]
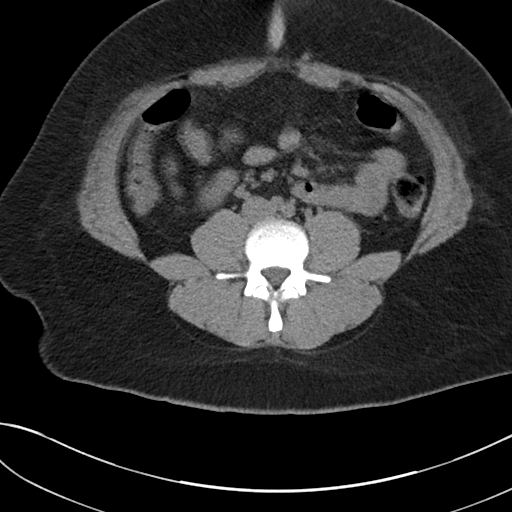
[im 55/90  soft-tissue]
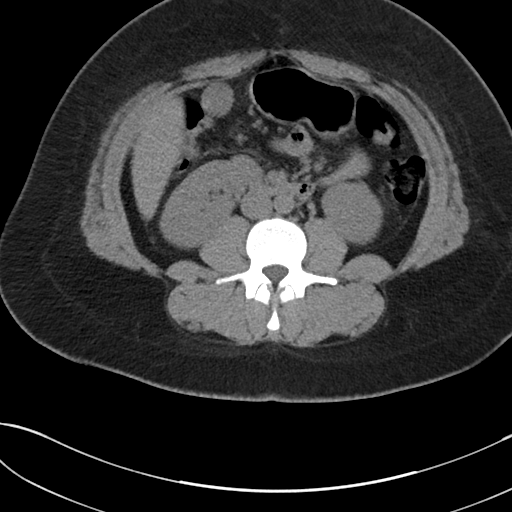
[im 55/90  bone]
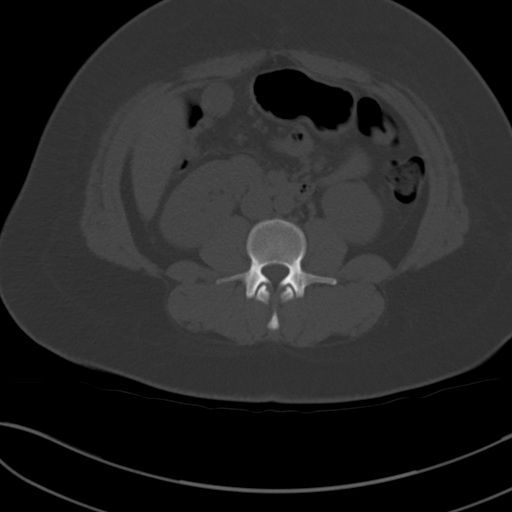
[im 61/90  soft-tissue]
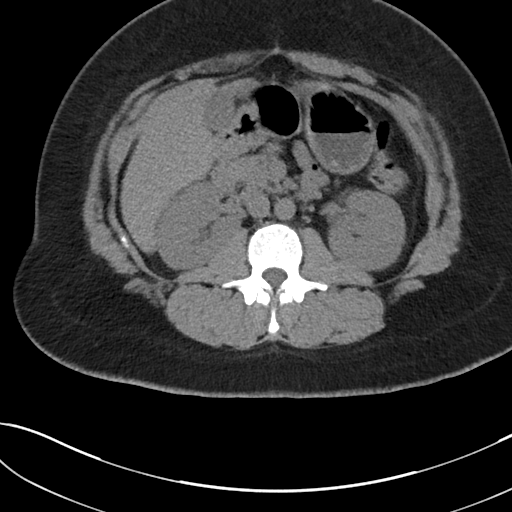
[im 67/90  soft-tissue]
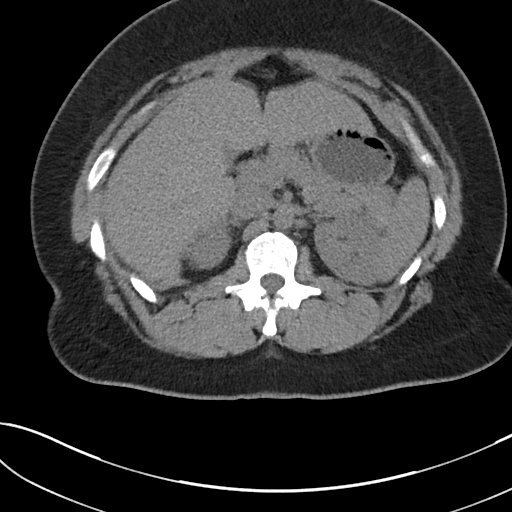
[im 72/90  soft-tissue]
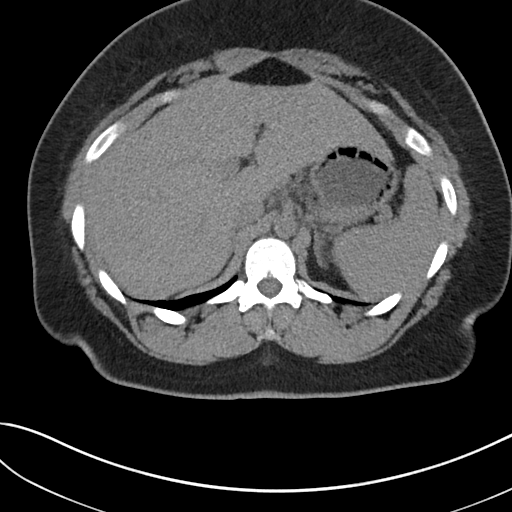
[im 78/90  soft-tissue]
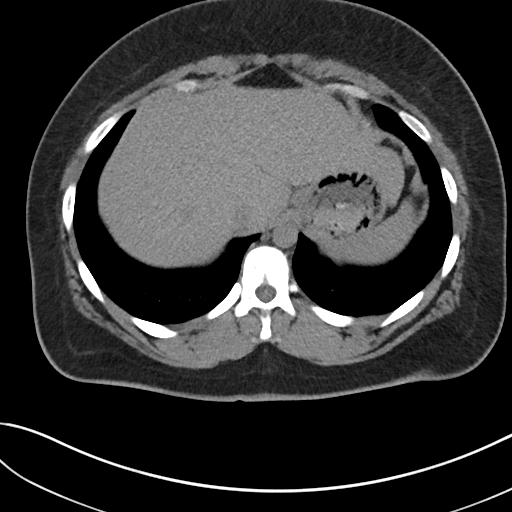
[im 84/90  soft-tissue]
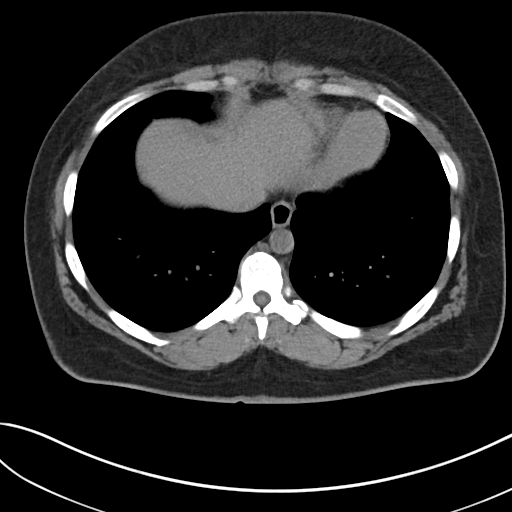

[Series 6: coronal soft tissue · coronal · 0.88mm/px · 3 of 108 slices shown]
[im 36/108  soft-tissue]
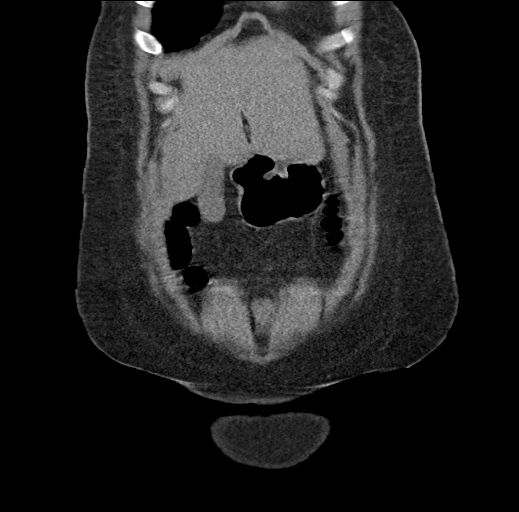
[im 48/108  soft-tissue]
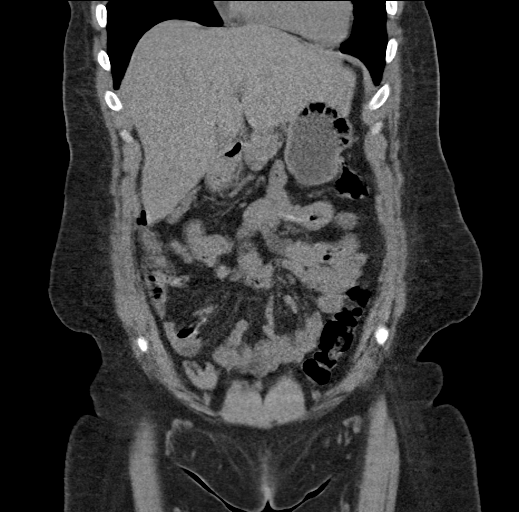
[im 60/108  soft-tissue]
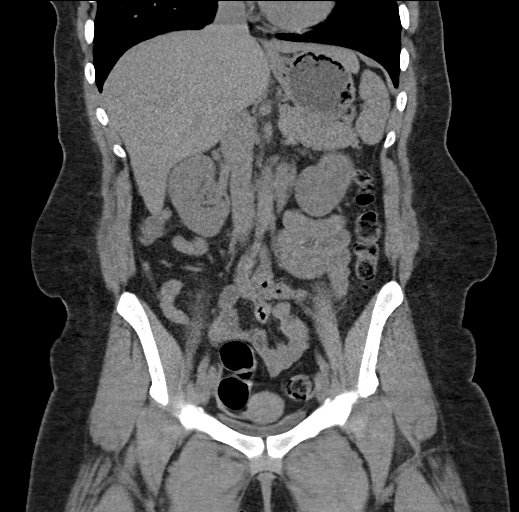

[17 of 46 positions shown; findings below may reference images not displayed]

FINDINGS: Lower chest: No acute abnormality.

Hepatobiliary: No focal liver abnormality. No gallstones,
gallbladder wall thickening, or pericholecystic fluid. No biliary
dilatation.

Pancreas: No focal lesion. Normal pancreatic contour. No surrounding
inflammatory changes. No main pancreatic ductal dilatation.

Spleen: Normal in size without focal abnormality.

Adrenals/Urinary Tract:

No adrenal nodule bilaterally.

No nephrolithiasis, no hydronephrosis, and no contour-deforming
renal mass. No ureterolithiasis or hydroureter.

The urinary bladder is unremarkable.

Stomach/Bowel: Stomach is within normal limits. No evidence of bowel
wall thickening or dilatation. Appendix appears normal.

Vascular/Lymphatic: Couple phleboliths noted within the pelvis. No
abdominal aorta or iliac aneurysm. No abdominal, pelvic, or inguinal
lymphadenopathy.

Reproductive: Uterus and bilateral adnexa are unremarkable.

Other: No intraperitoneal free fluid. No intraperitoneal free gas.
No organized fluid collection.

Musculoskeletal:

No abdominal wall hernia or abnormality.

No suspicious lytic or blastic osseous lesions. No acute displaced
fracture. Multilevel degenerative changes of the spine.
IMPRESSION: No acute intra-abdominal or intrapelvic abnormality with limited
evaluation on this noncontrast study.
